# Patient Record
Sex: Male | Born: 1975 | Race: Black or African American | Hispanic: No | Marital: Married | Smoking: Never smoker
Health system: Southern US, Community
[De-identification: ages and names within clinical notes are randomized; demographics above are authoritative.]

## PROBLEM LIST (undated history)

## (undated) HISTORY — PX: KNEE SURGERY: SHX244

---

## 2006-02-06 ENCOUNTER — Other Ambulatory Visit: Payer: Self-pay

## 2006-02-07 ENCOUNTER — Inpatient Hospital Stay: Payer: Self-pay | Admitting: Internal Medicine

## 2006-06-15 ENCOUNTER — Encounter: Payer: Self-pay | Admitting: Anesthesiology

## 2007-03-07 ENCOUNTER — Emergency Department: Payer: Self-pay | Admitting: Emergency Medicine

## 2011-10-23 ENCOUNTER — Emergency Department: Payer: Self-pay | Admitting: *Deleted
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
## 2017-01-08 ENCOUNTER — Encounter: Payer: Self-pay | Admitting: Emergency Medicine

## 2017-01-08 ENCOUNTER — Emergency Department
Admission: EM | Admit: 2017-01-08 | Discharge: 2017-01-08 | Disposition: A | Discharge status: Home / Self Care | Payer: No Typology Code available for payment source | Attending: Emergency Medicine | Admitting: Emergency Medicine

## 2017-01-08 DIAGNOSIS — Y9389 Activity, other specified: Secondary | ICD-10-CM | POA: Diagnosis not present

## 2017-01-08 DIAGNOSIS — M7918 Myalgia, other site: Secondary | ICD-10-CM

## 2017-01-08 DIAGNOSIS — Y999 Unspecified external cause status: Secondary | ICD-10-CM | POA: Diagnosis not present

## 2017-01-08 DIAGNOSIS — Y92410 Unspecified street and highway as the place of occurrence of the external cause: Secondary | ICD-10-CM | POA: Insufficient documentation

## 2017-01-08 DIAGNOSIS — S199XXA Unspecified injury of neck, initial encounter: Secondary | ICD-10-CM | POA: Diagnosis present

## 2017-01-08 DIAGNOSIS — S161XXA Strain of muscle, fascia and tendon at neck level, initial encounter: Secondary | ICD-10-CM | POA: Diagnosis not present

## 2017-01-08 MED ORDER — CYCLOBENZAPRINE HCL 10 MG PO TABS
10.0000 mg | ORAL_TABLET | Freq: Once | ORAL | Status: AC
  Administered 2017-01-08: 10 mg via ORAL
  Filled 2017-01-08: qty 1

## 2017-01-08 MED ORDER — CYCLOBENZAPRINE HCL 5 MG PO TABS: 5.0000 mg | ORAL_TABLET | Freq: Three times a day (TID) | ORAL | 0 refills | Status: AC | PRN
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
## 2017-01-08 NOTE — ED Notes (Signed)
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
## 2020-03-01 ENCOUNTER — Ambulatory Visit: Payer: Self-pay | Attending: Internal Medicine

## 2020-03-01 DIAGNOSIS — Z20822 Contact with and (suspected) exposure to covid-19: Secondary | ICD-10-CM | POA: Insufficient documentation

## 2020-03-02 LAB — NOVEL CORONAVIRUS, NAA: SARS-CoV-2, NAA: NOT DETECTED

## 2020-03-02 LAB — SARS-COV-2, NAA 2 DAY TAT
